# Patient Record
Sex: Male | Born: 1960 | Race: White | Hispanic: No | Marital: Married | Smoking: Never smoker
Health system: Southern US, Community
[De-identification: ages and names within clinical notes are randomized; demographics above are authoritative.]

## PROBLEM LIST (undated history)

## (undated) DIAGNOSIS — T7840XA Allergy, unspecified, initial encounter: Secondary | ICD-10-CM

## (undated) DIAGNOSIS — N189 Chronic kidney disease, unspecified: Secondary | ICD-10-CM

## (undated) DIAGNOSIS — E785 Hyperlipidemia, unspecified: Secondary | ICD-10-CM

## (undated) HISTORY — PX: FINGER SURGERY: SHX640

## (undated) HISTORY — DX: Chronic kidney disease, unspecified: N18.9

## (undated) HISTORY — PX: HERNIA REPAIR: SHX51

## (undated) HISTORY — DX: Allergy, unspecified, initial encounter: T78.40XA

## (undated) HISTORY — DX: Hyperlipidemia, unspecified: E78.5

---

## 2012-09-05 ENCOUNTER — Ambulatory Visit: Payer: Self-pay | Admitting: Sports Medicine

## 2012-09-19 ENCOUNTER — Ambulatory Visit: Payer: Self-pay | Admitting: Sports Medicine

## 2014-12-18 ENCOUNTER — Emergency Department (HOSPITAL_BASED_OUTPATIENT_CLINIC_OR_DEPARTMENT_OTHER)
Admission: EM | Admit: 2014-12-18 | Discharge: 2014-12-18 | Disposition: A | Discharge status: Home / Self Care | Payer: Managed Care, Other (non HMO) | Attending: Emergency Medicine | Admitting: Emergency Medicine

## 2014-12-18 ENCOUNTER — Encounter (HOSPITAL_BASED_OUTPATIENT_CLINIC_OR_DEPARTMENT_OTHER): Payer: Self-pay | Admitting: Emergency Medicine

## 2014-12-18 ENCOUNTER — Emergency Department (HOSPITAL_BASED_OUTPATIENT_CLINIC_OR_DEPARTMENT_OTHER): Payer: Managed Care, Other (non HMO)

## 2014-12-18 DIAGNOSIS — Y9389 Activity, other specified: Secondary | ICD-10-CM | POA: Diagnosis not present

## 2014-12-18 DIAGNOSIS — Z23 Encounter for immunization: Secondary | ICD-10-CM | POA: Insufficient documentation

## 2014-12-18 DIAGNOSIS — Y288XXA Contact with other sharp object, undetermined intent, initial encounter: Secondary | ICD-10-CM | POA: Diagnosis not present

## 2014-12-18 DIAGNOSIS — Y9289 Other specified places as the place of occurrence of the external cause: Secondary | ICD-10-CM | POA: Insufficient documentation

## 2014-12-18 DIAGNOSIS — Y998 Other external cause status: Secondary | ICD-10-CM | POA: Diagnosis not present

## 2014-12-18 DIAGNOSIS — S61219A Laceration without foreign body of unspecified finger without damage to nail, initial encounter: Secondary | ICD-10-CM

## 2014-12-18 DIAGNOSIS — S61210A Laceration without foreign body of right index finger without damage to nail, initial encounter: Secondary | ICD-10-CM | POA: Insufficient documentation

## 2014-12-18 DIAGNOSIS — S6991XA Unspecified injury of right wrist, hand and finger(s), initial encounter: Secondary | ICD-10-CM | POA: Diagnosis present

## 2014-12-18 MED ORDER — LIDOCAINE HCL (PF) 1 % IJ SOLN
10.0000 mL | Freq: Once | INTRAMUSCULAR | Status: AC
  Administered 2014-12-18: 10 mL via INTRADERMAL
  Filled 2014-12-18: qty 10

## 2014-12-18 MED ORDER — CEFAZOLIN SODIUM 1 G IJ SOLR
2.0000 g | Freq: Once | INTRAMUSCULAR | Status: AC
  Administered 2014-12-18: 2 g via INTRAMUSCULAR
  Filled 2014-12-18: qty 20

## 2014-12-18 MED ORDER — LIDOCAINE HCL (PF) 1 % IJ SOLN
30.0000 mL | Freq: Once | INTRAMUSCULAR | Status: DC
  Filled 2014-12-18: qty 30

## 2014-12-18 MED ORDER — TETANUS-DIPHTH-ACELL PERTUSSIS 5-2.5-18.5 LF-MCG/0.5 IM SUSP
0.5000 mL | Freq: Once | INTRAMUSCULAR | Status: AC
  Administered 2014-12-18: 0.5 mL via INTRAMUSCULAR
  Filled 2014-12-18: qty 0.5

## 2014-12-18 NOTE — ED Notes (Addendum)
Patient c/o laceration to 1st finger on right hand.  He states he was using a Counsellor and injured his finger on the blade.  He states he was on a ladder, trimming tree branches.  Gauze intact and bleeding controlled.

## 2014-12-18 NOTE — Discharge Instructions (Signed)

## 2014-12-19 NOTE — ED Provider Notes (Signed)
CSN: [id number]     Arrival date & time 12/18/14  1057 History   First MD Initiated Contact with Patient 12/18/14 1101     Chief Complaint  Patient presents with  . Finger Injury     (Consider location/radiation/quality/duration/timing/severity/associated sxs/prior Treatment) Patient is a 54 y.o. male presenting with skin laceration.  Laceration Location: R index finger. Length (cm):  2 Depth:  Through underlying tissue Bleeding: controlled   Time since incident:  1 hour Injury mechanism: hedge trimmer. Pain details:    Quality:  Aching   Severity:  Severe   Timing:  Constant   Progression:  Unchanged Foreign body present:  No foreign bodies Relieved by:  Nothing Worsened by:  Movement and pressure Tetanus status:  Unknown   History reviewed. No pertinent past medical history. History reviewed. No pertinent past surgical history. History reviewed. No pertinent family history. Social History  Substance Use Topics  . Smoking status: Never Smoker   . Smokeless tobacco: None  . Alcohol Use: Yes     Comment: occasionally    Review of Systems  All other systems reviewed and are negative.     Allergies  Review of patient's allergies indicates no known allergies.  Home Medications   Prior to Admission medications   Not on File   BP 119/80 mmHg  Pulse 72  Temp(Src) 98.8 F (37.1 C) (Oral)  Resp 18  Ht 6' (1.829 m)  Wt 205 lb (92.987 kg)  BMI 27.80 kg/m2  SpO2 98% Physical Exam  Constitutional: He is oriented to person, place, and time. He appears well-developed and well-nourished.  HENT:  Head: Normocephalic and atraumatic.  Eyes: Conjunctivae and EOM are normal.  Neck: Normal range of motion. Neck supple.  Cardiovascular: Normal rate, regular rhythm and normal heart sounds.   Pulmonary/Chest: Effort normal and breath sounds normal. No respiratory distress.  Abdominal: He exhibits no distension. There is no tenderness. There is no rebound and no guarding.   Musculoskeletal: Normal range of motion.  Neurological: He is alert and oriented to person, place, and time.  Skin: Skin is warm and dry.  Deep laceration through pulp of R 2nd distal phalanx ending 5mm proximal to cuticle.  NV intact distally.  Length 2cm  Vitals reviewed.   ED Course  Procedures (including critical care time) Labs Review Labs Reviewed - No data to display  Imaging Review Dg Finger Index Right  12/18/2014   CLINICAL DATA:  Injury while using hedge trimmer  EXAM: RIGHT SECOND FINGER 2+V  COMPARISON:  None.  FINDINGS: Frontal, oblique, and lateral views were obtained. There is an obliquely oriented nondisplaced fracture of the midportion of the second distal phalanx. There is associated soft tissue injury. No other fracture. No dislocation. No radiopaque foreign body. Joint spaces appear intact.  IMPRESSION: Obliquely oriented fracture midportion second distal phalanx with soft tissue injury. No radiopaque foreign body. No dislocation. No appreciable arthropathic change.   Electronically Signed   By: Bretta Bang III M.D.   On: 12/18/2014 12:17   I have personally reviewed and evaluated these images and lab results as part of my medical decision-making.   EKG Interpretation None      MDM   Final diagnoses:  Finger laceration, initial encounter    54 y.o. male without pertinent PMH presents with laceration of right index finger from a hedge trimmer. Tetanus status unknown.  TDAp updated.  XR revealed underlying fx.  Spoke with Dr. Amanda Pea of hand, who requested pt be dc  to fu immediately in clinic.  2gm ancef IM given.  DC in stable condition    I have reviewed all laboratory and imaging studies if ordered as above  1. Finger laceration, initial encounter         Mirian Mo, MD 12/19/14 0730

## 2016-09-20 IMAGING — DX DG FINGER INDEX 2+V*R*
3 series · 3 of 3 positions shown · non-contrast
Comparison: None.

CLINICAL DATA: Injury while using [employer]

EXAM:
RIGHT SECOND FINGER 2+V

[finger ap]
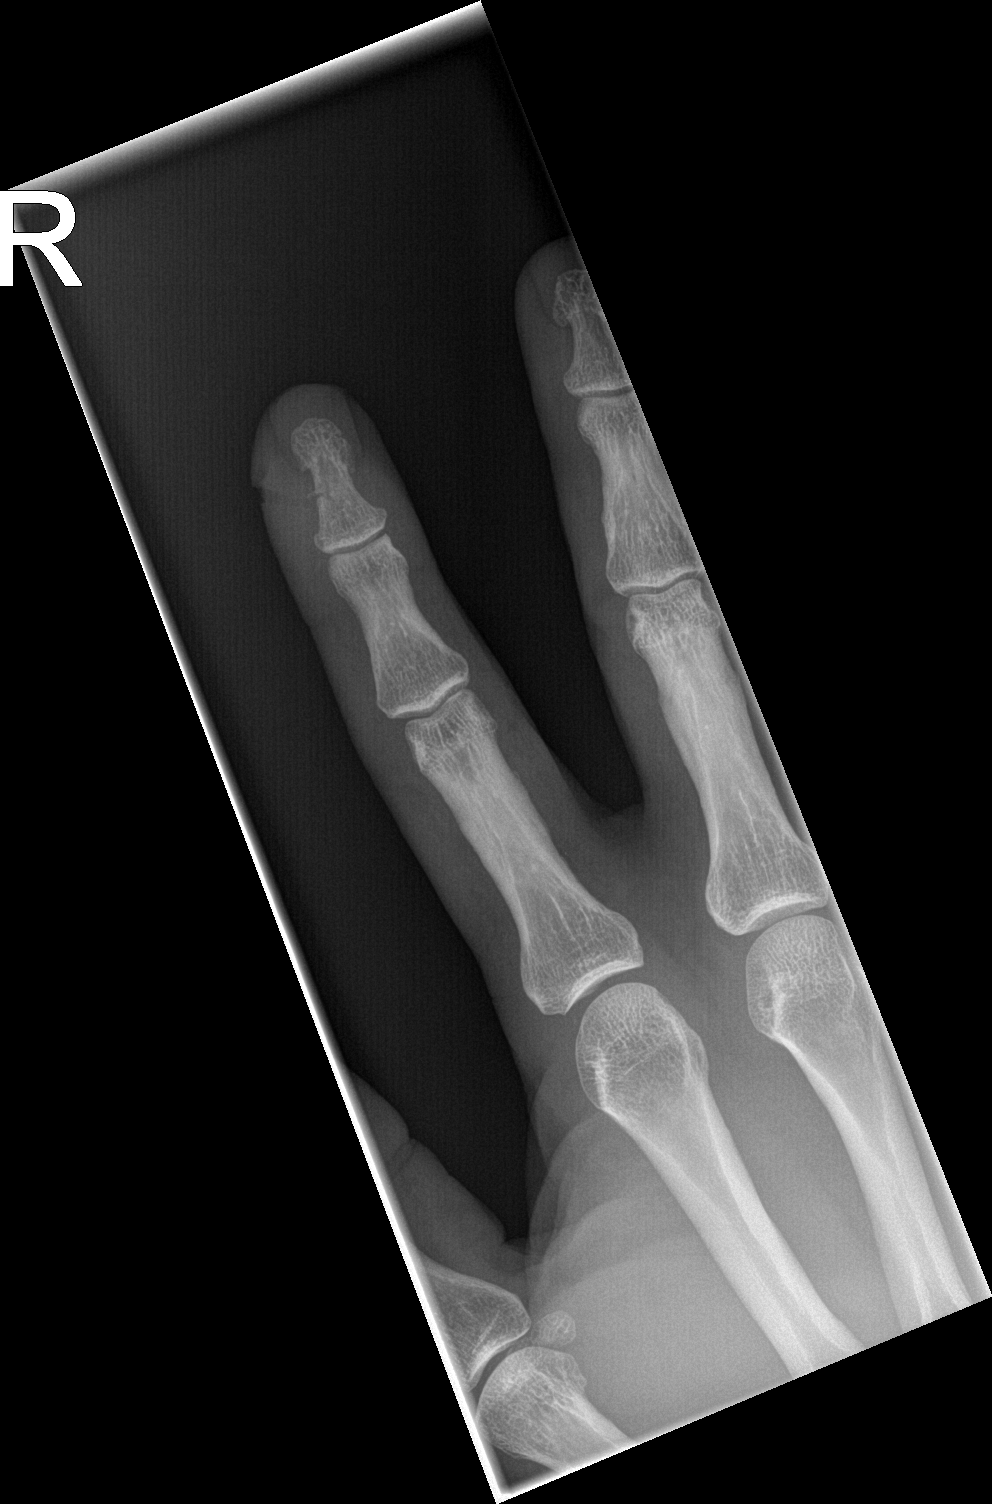

[finger obl]
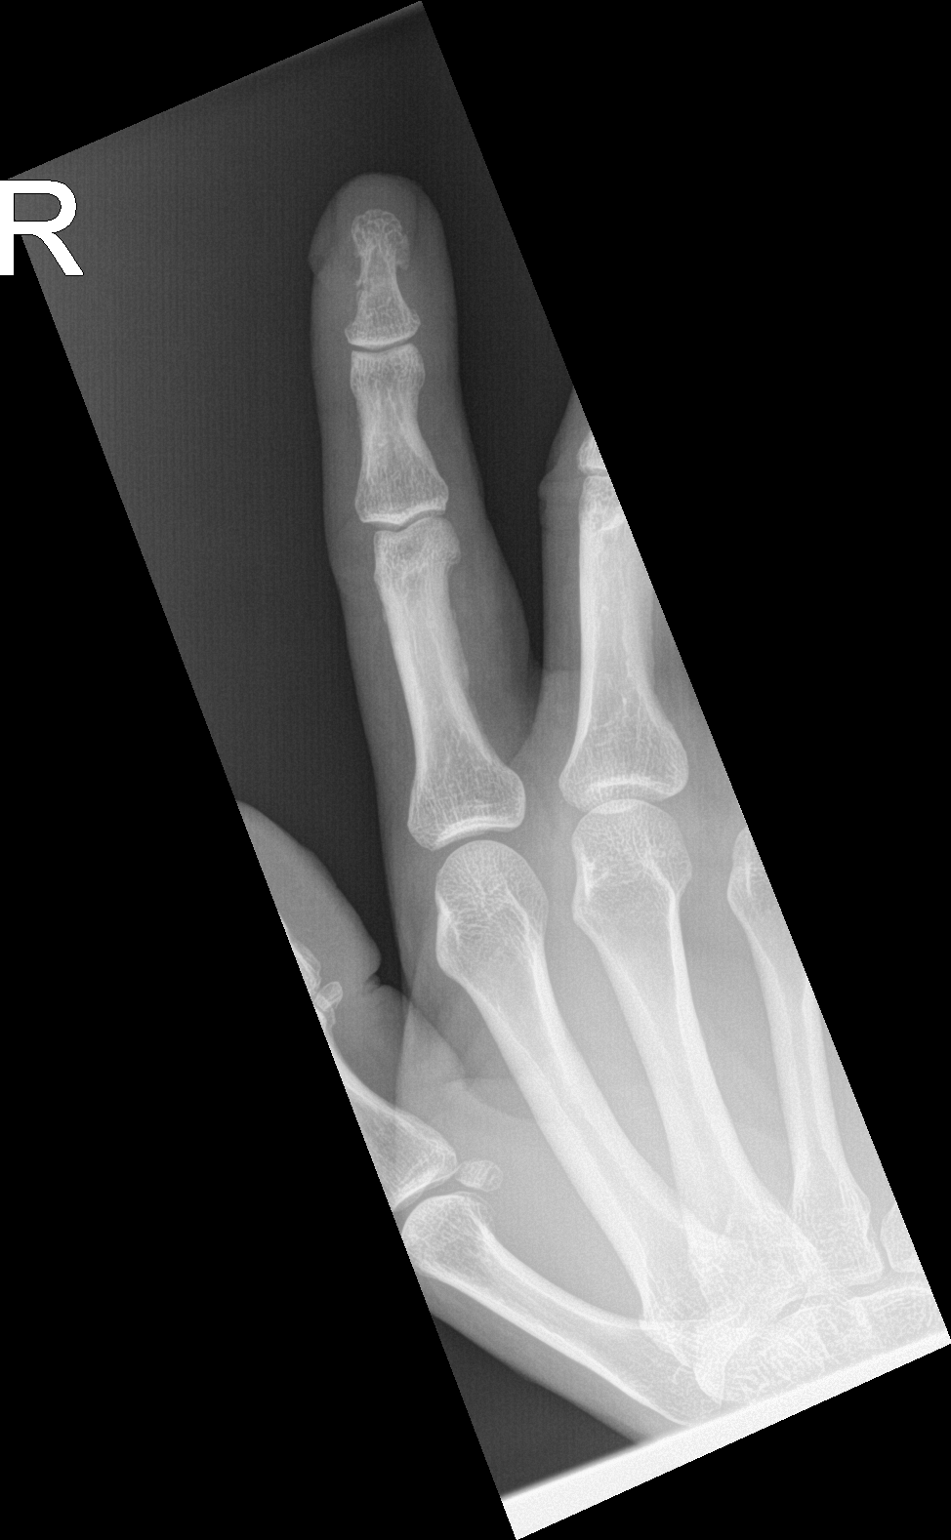

[finger lat]
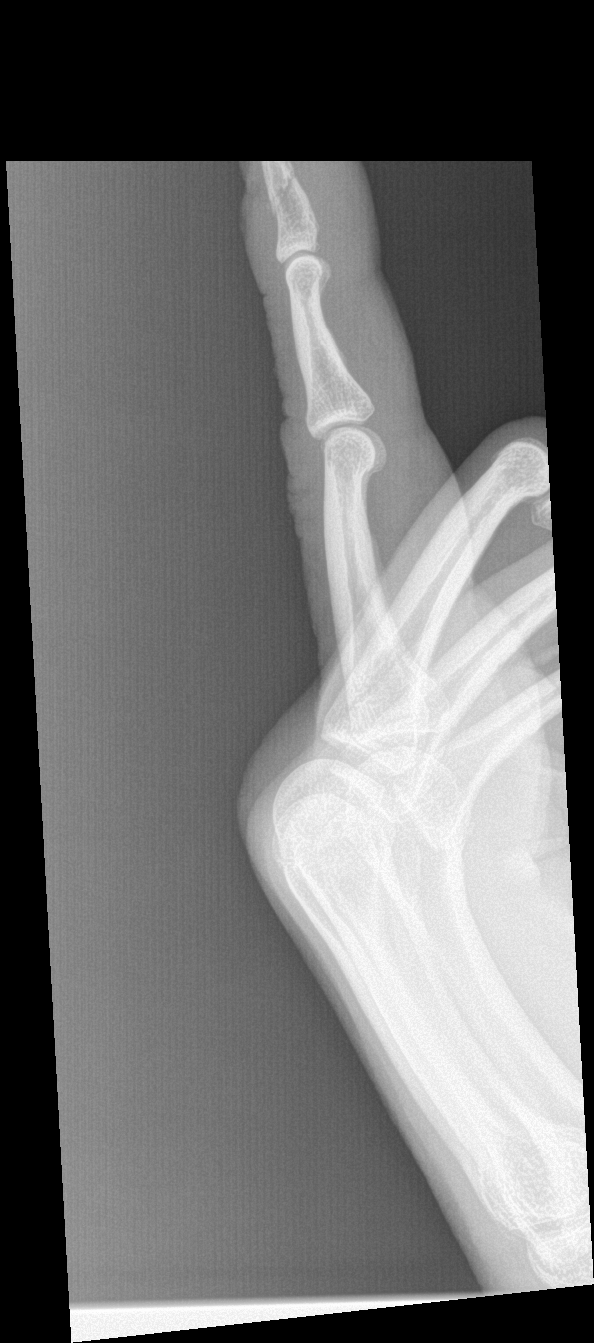

[3 of 3 positions shown; findings below may reference images not displayed]

FINDINGS: Frontal, oblique, and lateral views were obtained. There is an
obliquely oriented nondisplaced fracture of the midportion of the
second distal phalanx. There is associated soft tissue injury. No
other fracture. No dislocation. No radiopaque foreign body. Joint
spaces appear intact.
IMPRESSION: Obliquely oriented fracture midportion second distal phalanx with
soft tissue injury. No radiopaque foreign body. No dislocation. No
appreciable arthropathic change.

## 2018-10-30 LAB — LIPID PANEL
Cholesterol: 306 — AB (ref 0–200)
HDL: 43 (ref 35–70)
LDL Cholesterol: 198

## 2018-10-30 LAB — PSA: PSA: 1.03

## 2018-10-30 LAB — BASIC METABOLIC PANEL: Glucose: 89

## 2020-06-24 NOTE — Progress Notes (Signed)
Phone: [phone]   Subjective:  Patient presents today to establish care.  Prior patient of [facility].  Chief Complaint  Patient presents with  . New Patient (Initial Visit)    Establishing care    See problem oriented charting  ROS- full ROS completed and negative except for weight gain of 20 lbs over covid 19 period- last 2 years- possibly more, arthritis in fingers- also has had carpal tunnel, high cholesterol  The following were reviewed and entered/updated in epic: Past Medical History:  Diagnosis Date  . Allergy   . 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 Hyperlipidemia    Patient Active Problem List   Diagnosis Date Noted  . Hyperlipidemia 06/25/2020    Priority: Medium  . 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 Allergic rhinitis 06/25/2020    Priority: Low  . Carpal tunnel syndrome 06/25/2020    Priority: Low  . Osteoarthritis, hand 06/25/2020    Priority: Low   Past Surgical History:  Procedure Laterality Date  . FINGER SURGERY     hedge trimmer accident- right 2nd finger  . HERNIA REPAIR      Family History  Problem Relation Age of Onset  . Arthritis Mother   . Hyperlipidemia Mother   . Alcohol abuse Father   . Benign prostatic hyperplasia Father        sounds like TURP  . Diabetes Sister   . Other Sister        car accident  . Other Maternal Grandmother        lived ot 53  . Heart disease Maternal Grandfather        open heart late 69s or 60s. never smoker  . Heart attack Maternal Grandfather   . Lymphoma Maternal Grandfather   . Early death Paternal Grandmother        unclear cause and age  . Early death Paternal Grandfather        unclear cause and age    Medications- reviewed and updated Current Outpatient Medications  Medication Sig Dispense Refill  . Multiple Vitamin (MULTI-VITAMIN DAILY PO) Take by mouth.     No current facility-administered medications for this visit.    Allergies-reviewed and  updated Allergies  Allergen Reactions  . Bee Pollen Itching    Eyes itch and water.   Valentino Saxon Swelling    Tongue swelling     Social History   Social History Narrative   Married. 86 year old son at [employer] and 85 year old senior at [employer] in 06/2020.       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 Oropharynx normal. TM normal. Eyes: sclera and lids normal, PERRLA Neck: no thyromegaly, no cervical lymphadenopathy CV: RRR no murmurs rubs or gallops Lungs: CTAB no crackles, wheeze, rhonchi Abdomen: soft/nontender/nondistended/normal bowel sounds. No rebound or guarding.  Ext: no edema Skin: warm, dry Neuro: 5/5 strength in upper and lower extremities, normal gait     Assessment and Plan  60 y.o. male presenting for annual physical.  Health Maintenance counseling: 1. Anticipatory guidance: Patient  counseled regarding regular dental exams -q6 months, eye exams - yearlyyearly,  avoiding smoking and second hand smoke , limiting alcohol to 2 beverages per day - discussed cutting down to 14 max per week- also consider therapy- about at 18 a week and occasional binges- could consider AA.   2. Risk factor reduction:  Advised patient of need for regular exercise and diet rich and fruits and vegetables to reduce risk of heart attack and stroke. Exercise- once or twice a week- run/jog- also has [employer] membership- might use ellipitcal. Diet-recommended calorie counting or using half plate method- he has gained some over pandemic and wants to lose this.  Wt Readings from Last 3 Encounters:  06/25/20 219 lb 3.2 oz (99.4 kg)  12/18/14 205 lb (93 kg)  3. Immunizations/screenings/ancillary studies- consider  shingrix in future- has busy weekend planned. HCV screen and HIV screen- has donated blood to [facility] within 1 years Immunization History  Administered Date(s) Administered  . Influenza-Unspecified 01/16/2020  . PFIZER(Purple Top)SARS-COV-2 Vaccination 07/13/2019, 08/09/2019, 03/07/2020  . Tdap 12/18/2014  4. Prostate cancer screening- will trend with labs 5. Colon cancer screening - had in 2016- we will get records 6. Skin cancer screening- no dermatologist. advised regular sunscreen use. Denies worrisome, changing, or new skin lesions.  7. Never smoker 8. STD screening - only active with wife  Status of chronic or acute concerns   #hyperlipidemia S: Medication: none  No results found for: CHOL, HDL, LDLCALC, LDLDIRECT, TRIG, CHOLHDL A/P: Has had prior total cholesterol over 300 and LDL over 190 (has gotten #s down with weight loss in past)-he has preferred to remain off medication and try to work on lifestyle.  We will update levels today and reevaluate -prefers to work on lifestyle still (overweight status noted) -interested in coronary calcium- knows has $99 test.   Recommended follow up: Return in about 1 year (around 06/25/2021) for physical or sooner if needed.  Lab/Order associations: will come back fasting   ICD-10-CM   1. Seasonal allergic rhinitis due to pollen  J30.1   2. Preventative health care  Z00.00 CBC with Differential/Platelet    Comprehensive metabolic panel    Lipid panel    PSA    POCT Urinalysis Dipstick (Automated)  3. Hyperlipidemia, unspecified hyperlipidemia type  E78.5 CBC with Differential/Platelet    Comprehensive metabolic panel    Lipid panel    POCT Urinalysis Dipstick (Automated)  4. Horseshoe kidney  Q63.1 POCT Urinalysis Dipstick (Automated)  5. Bilateral carpal tunnel syndrome  G56.03   6. Primary osteoarthritis of left hand  M19.042   7. Screening for prostate cancer  Z12.5 PSA    No orders of the defined types were placed in this  encounter.   Return precautions advised.  Tana Conch, MD

## 2020-06-24 NOTE — Patient Instructions (Addendum)
  Please stop by lab before you go If you have mychart- we will send your results within 3 business days of [place] receiving them.  If you do not have mychart- we will call you about results within 5 business days of [place] receiving them.  *please also note that you will see labs on mychart as soon as they post. I will later go in and write notes on them- will say "notes from Dr. Durene Cal"  Please call [phone] to schedule a visit with [facility]- I think this is reasonable to consider with alcohol intake. Could also consider AA  Health Maintenance Due  Topic Date Due  . COLONOSCOPY- Sign release of information at the check out desk for last colonoscopy from [facility] Never done   Schedule a lab visit at the check out desk within 2 weeks. Return for future fasting labs meaning nothing but water after midnight please. Ok to take your medications with water.   -you can also try to schedule a nurse visit for Shingrix # Repeat injection in 2-5 months after first injection. Schedule a nurse visit for the 2nd injection before you leave today as well (at the check out desk)  Recommended follow up: Return in about 1 year (around 06/25/2021) for physical or sooner if needed.

## 2020-06-25 ENCOUNTER — Ambulatory Visit (INDEPENDENT_AMBULATORY_CARE_PROVIDER_SITE_OTHER): Payer: 59 | Admitting: Family Medicine

## 2020-06-25 ENCOUNTER — Other Ambulatory Visit: Payer: Self-pay

## 2020-06-25 ENCOUNTER — Encounter: Payer: Self-pay | Admitting: Family Medicine

## 2020-06-25 VITALS — BP 118/78 | HR 67 | Temp 97.8°F | Ht 72.0 in | Wt 219.2 lb

## 2020-06-25 DIAGNOSIS — E785 Hyperlipidemia, unspecified: Secondary | ICD-10-CM | POA: Diagnosis not present

## 2020-06-25 DIAGNOSIS — M19049 Primary osteoarthritis, unspecified hand: Secondary | ICD-10-CM | POA: Insufficient documentation

## 2020-06-25 DIAGNOSIS — Q631 Lobulated, fused and horseshoe kidney: Secondary | ICD-10-CM | POA: Diagnosis not present

## 2020-06-25 DIAGNOSIS — J301 Allergic rhinitis due to pollen: Secondary | ICD-10-CM | POA: Diagnosis not present

## 2020-06-25 DIAGNOSIS — Z0000 Encounter for general adult medical examination without abnormal findings: Secondary | ICD-10-CM | POA: Diagnosis not present

## 2020-06-25 DIAGNOSIS — Z1159 Encounter for screening for other viral diseases: Secondary | ICD-10-CM

## 2020-06-25 DIAGNOSIS — Z114 Encounter for screening for human immunodeficiency virus [HIV]: Secondary | ICD-10-CM

## 2020-06-25 DIAGNOSIS — G5603 Carpal tunnel syndrome, bilateral upper limbs: Secondary | ICD-10-CM

## 2020-06-25 DIAGNOSIS — M19042 Primary osteoarthritis, left hand: Secondary | ICD-10-CM

## 2020-06-25 DIAGNOSIS — G5600 Carpal tunnel syndrome, unspecified upper limb: Secondary | ICD-10-CM | POA: Insufficient documentation

## 2020-06-25 DIAGNOSIS — J309 Allergic rhinitis, unspecified: Secondary | ICD-10-CM | POA: Insufficient documentation

## 2020-06-25 DIAGNOSIS — Z125 Encounter for screening for malignant neoplasm of prostate: Secondary | ICD-10-CM

## 2020-06-25 DIAGNOSIS — Z1211 Encounter for screening for malignant neoplasm of colon: Secondary | ICD-10-CM

## 2020-06-25 NOTE — Addendum Note (Signed)
Addended by: Shelva Majestic on: 06/25/2020 05:13 PM   Modules accepted: Level of Service

## 2020-06-29 ENCOUNTER — Encounter: Payer: Self-pay | Admitting: Family Medicine

## 2020-07-14 ENCOUNTER — Other Ambulatory Visit: Payer: 59

## 2020-07-14 ENCOUNTER — Ambulatory Visit: Payer: 59

## 2020-07-21 ENCOUNTER — Other Ambulatory Visit: Payer: 59

## 2020-07-21 ENCOUNTER — Ambulatory Visit: Payer: 59

## 2020-08-11 ENCOUNTER — Other Ambulatory Visit: Payer: 59

## 2020-08-11 ENCOUNTER — Ambulatory Visit: Payer: 59

## 2020-08-19 ENCOUNTER — Ambulatory Visit (INDEPENDENT_AMBULATORY_CARE_PROVIDER_SITE_OTHER): Payer: 59

## 2020-08-19 ENCOUNTER — Other Ambulatory Visit: Payer: Self-pay

## 2020-08-19 ENCOUNTER — Other Ambulatory Visit (INDEPENDENT_AMBULATORY_CARE_PROVIDER_SITE_OTHER): Payer: 59

## 2020-08-19 DIAGNOSIS — Z23 Encounter for immunization: Secondary | ICD-10-CM | POA: Diagnosis not present

## 2020-08-19 DIAGNOSIS — Z125 Encounter for screening for malignant neoplasm of prostate: Secondary | ICD-10-CM

## 2020-08-19 DIAGNOSIS — E785 Hyperlipidemia, unspecified: Secondary | ICD-10-CM | POA: Diagnosis not present

## 2020-08-19 DIAGNOSIS — Z0000 Encounter for general adult medical examination without abnormal findings: Secondary | ICD-10-CM | POA: Diagnosis not present

## 2020-08-19 LAB — COMPREHENSIVE METABOLIC PANEL
ALT: 30 U/L (ref 0–53)
AST: 29 U/L (ref 0–37)
Albumin: 4.4 g/dL (ref 3.5–5.2)
Alkaline Phosphatase: 67 U/L (ref 39–117)
BUN: 18 mg/dL (ref 6–23)
CO2: 25 mEq/L (ref 19–32)
Calcium: 9.2 mg/dL (ref 8.4–10.5)
Chloride: 99 mEq/L (ref 96–112)
Creatinine, Ser: 0.92 mg/dL (ref 0.40–1.50)
GFR: 92.41 mL/min (ref >60.00)
Glucose, Bld: 90 mg/dL (ref 70–99)
Potassium: 4.5 mEq/L (ref 3.5–5.1)
Sodium: 136 mEq/L (ref 135–145)
Total Bilirubin: 0.6 mg/dL (ref 0.2–1.2)
Total Protein: 7.8 g/dL (ref 6.0–8.3)

## 2020-08-19 LAB — LIPID PANEL
Cholesterol: 309 mg/dL — ABNORMAL HIGH (ref 0–200)
HDL: 44.8 mg/dL (ref >39.00)
Total CHOL/HDL Ratio: 7
Triglycerides: 498 mg/dL — ABNORMAL HIGH (ref 0.0–149.0)

## 2020-08-19 LAB — CBC WITH DIFFERENTIAL/PLATELET
Basophils Absolute: 0 10*3/uL (ref 0.0–0.1)
Basophils Relative: 0.8 % (ref 0.0–3.0)
Eosinophils Absolute: 0.1 10*3/uL (ref 0.0–0.7)
Eosinophils Relative: 2.1 % (ref 0.0–5.0)
HCT: 40.9 % (ref 39.0–52.0)
Hemoglobin: 14.1 g/dL (ref 13.0–17.0)
Lymphocytes Relative: 30.9 % (ref 12.0–46.0)
Lymphs Abs: 1.9 10*3/uL (ref 0.7–4.0)
MCHC: 34.5 g/dL (ref 30.0–36.0)
MCV: 93.5 fl (ref 78.0–100.0)
Monocytes Absolute: 0.7 10*3/uL (ref 0.1–1.0)
Monocytes Relative: 11.4 % (ref 3.0–12.0)
Neutro Abs: 3.4 10*3/uL (ref 1.4–7.7)
Neutrophils Relative %: 54.8 % (ref 43.0–77.0)
Platelets: 272 10*3/uL (ref 150.0–400.0)
RBC: 4.37 Mil/uL (ref 4.22–5.81)
RDW: 13.1 % (ref 11.5–15.5)
WBC: 6.1 10*3/uL (ref 4.0–10.5)

## 2020-08-19 LAB — LDL CHOLESTEROL, DIRECT: Direct LDL: 165 mg/dL

## 2020-08-19 LAB — PSA: PSA: 1.03 ng/mL (ref 0.10–4.00)

## 2020-08-23 ENCOUNTER — Other Ambulatory Visit: Payer: Self-pay | Admitting: Family Medicine

## 2020-08-23 DIAGNOSIS — E785 Hyperlipidemia, unspecified: Secondary | ICD-10-CM

## 2020-08-23 NOTE — Progress Notes (Unsigned)
The 10-year ASCVD risk score Denman George DC Montez Hageman., et al., 2013) is: 10%   Values used to calculate the score:     Age: 60 years     Sex: Male     Is Non-Hispanic African American: No     Diabetic: No     Tobacco smoker: No     Systolic Blood Pressure: 118 mmHg     Is BP treated: No     HDL Cholesterol: 44.8 mg/dL     Total Cholesterol: 309 mg/dL

## 2020-11-09 ENCOUNTER — Ambulatory Visit (INDEPENDENT_AMBULATORY_CARE_PROVIDER_SITE_OTHER): Payer: 59

## 2020-11-09 ENCOUNTER — Other Ambulatory Visit: Payer: Self-pay

## 2020-11-09 DIAGNOSIS — Z23 Encounter for immunization: Secondary | ICD-10-CM

## 2021-06-17 NOTE — Progress Notes (Signed)
? ?Phone: [phone] ?  ?Subjective:  ?Patient presents today for their annual physical. Chief complaint-noted.  ? ?See problem oriented charting- ?ROS- full  review of systems was completed and negative  ?except for: chest pain, dizziness ? ?The following were reviewed and entered/updated in epic: ?Past Medical History:  ?Diagnosis Date  ? Allergy   ? Chronic kidney disease   ? 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 Hyperlipidemia   ? ?Patient Active Problem List  ? Diagnosis Date Noted  ? Exertional chest pain 06/27/2021  ?  Priority: High  ? Hyperlipidemia 06/25/2020  ?  Priority: Medium   ? 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  Priority: Medium   ? Allergic rhinitis 06/25/2020  ?  Priority: Low  ? Carpal tunnel syndrome 06/25/2020  ?  Priority: Low  ? 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  Priority: Low  ? ?Past Surgical History:  ?Procedure Laterality Date  ? FINGER SURGERY    ? hedge trimmer accident- right 2nd finger  ? HERNIA REPAIR    ? ? ?Family History  ?Problem Relation Age of Onset  ? Arthritis Mother   ? Hyperlipidemia Mother   ? Alcohol abuse Father   ? Benign prostatic hyperplasia Father   ?     sounds like TURP  ? Diabetes Sister   ? Other Sister   ?     car accident  ? Other Maternal Grandmother   ?     lived ot 38  ? Heart disease Maternal Grandfather   ?     open heart late 61s or 60s. never smoker  ? Heart attack Maternal Grandfather   ? Lymphoma Maternal Grandfather   ? Early death Paternal Grandmother   ?     unclear cause and age  ? Early death Paternal Grandfather   ?     unclear cause and age  ? ?Medications- reviewed and updated ?Current Outpatient Medications  ?Medication Sig Dispense Refill  ? aspirin EC 81 MG tablet Take 81 mg by mouth daily. Swallow whole.    ? Multiple Vitamin (MULTI-VITAMIN DAILY PO) Take by mouth.    ? ?No current facility-administered medications for this visit.  ? ?Allergies-reviewed and updated ?Allergies  ?Allergen Reactions  ? Bee Pollen Itching  ?  Eyes itch and water.   ? Cherry  Swelling  ?  Tongue swelling   ? ? ?Social History  ? ?Social History Narrative  ? Married. 27 year old son at [employer] senior and 68 year old freshman at [employer] 06/2021  ?   ? 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   ? Hobbies: shows, music, movies, golf, tennis, yardwork  ? ?Objective  ?Objective:  ?BP 102/60   Pulse (!) 56   Temp 97.7 ?F (36.5 ?C)   Ht 6' (1.829 m)   Wt 221 lb (100.2 kg)   SpO2 98%   BMI 29.97 kg/m?  ?Gen: NAD, resting comfortably ?HEENT: Mucous membranes are moist. Oropharynx normal. TM normal ?Neck: no thyromegaly ?CV: RRR no murmurs rubs or gallops ?Lungs: CTAB no crackles, wheeze, rhonchi ?Abdomen: soft/nontender/nondistended/normal bowel sounds. No rebound or guarding.  ?Ext: no edema ?Skin: warm, dry ?Neuro: grossly normal, moves all extremities, PERRLA ? ?  ?Assessment and Plan  ?61 y.o. male presenting for annual physical.  ?Health Maintenance counseling: ?1. Anticipatory guidance: Patient counseled regarding regular dental exams -q6 months, eye exams -yearly,  avoiding smoking and second hand smoke , limiting alcohol to 2 beverages per day - has cut  down on alcohol- down to 4 a week from 18- should help triglycerides. No illicit drugs.  ?2. Risk factor reduction:  Advised patient of need for regular exercise and diet rich and fruits and vegetables to reduce risk of heart attack and stroke.  ?Exercise- two to 3 days a week on elliptical for 30 minutes after Crhistmas ?Diet/weight management- weight within 2 lbs of last year- was heavier coming out of holidays and has trended down ?Wt Readings from Last 3 Encounters:  ?06/27/21 221 lb (100.2 kg)  ?06/25/20 219 lb 3.2 oz (99.4 kg)  ?12/18/14 205 lb (93 kg)  ?3. Immunizations/screenings/ancillary studies-fully up-to-date  ?Immunization History  ?Administered Date(s) Administered  ? Influenza,inj,Quad PF,6+ Mos 03/31/2021  ? Influenza-Unspecified 01/16/2020  ? PFIZER(Purple Top)SARS-COV-2 Vaccination 07/13/2019, 08/09/2019, 03/07/2020   ? Research officer, trade union 43yrs & up 03/31/2021  ? Tdap 12/18/2014  ? Zoster Recombinat (Shingrix) 08/19/2020, 11/09/2020  ?4. Prostate cancer screening- will trend with labs   ?Lab Results  ?Component Value Date  ? PSA 1.03 08/19/2020  ? PSA 1.03 10/30/2018  ? 5. Colon cancer screening -  had in 2016- we will get records. States referre dby [facility] manager but didn't see Deboraha Sprang- will look fro records ?6. Skin cancer screening-  no dermatologist. advised regular sunscreen use. Denies worrisome, changing, or new skin lesions.  ?7. Smoking associated screening (lung cancer screening, AAA screen 65-75, UA)- never smoker ?8. STD screening - only active with wife ? ?Status of chronic or acute concerns  ? ?#Chest pain and hyperlipidemia ?See problem oriented charting/separate note ? ?Recommended follow up: Return in about 1 year (around 06/28/2022) for physical or sooner if needed. Such as related to cchest pain ? ?Lab/Order associations: NOT fasting ?  ICD-10-CM   ?1. Preventative health care  Z00.00   ?  ?2. Chest tightness  R07.89 EKG 12-Lead  ?  Ambulatory referral to Cardiology  ?  ?3. Hyperlipidemia, unspecified hyperlipidemia type  E78.5 CBC with Differential/Platelet  ?  Comprehensive metabolic panel  ?  Lipid panel  ?  ?4. Encounter for general adult medical examination with abnormal findings  Z00.01   ?  ?5. Screening for prostate cancer  Z12.5 PSA  ?  ?6. Exertional chest pain  R07.9 Ambulatory referral to Cardiology  ?  ?7. Screening exam for skin cancer  Z12.83 Ambulatory referral to Dermatology  ?  ? ?Meds ordered this encounter  ?Medications  ? rosuvastatin (CRESTOR) 20 MG tablet  ?  Sig: Take 1 tablet (20 mg total) by mouth daily.  ?  Dispense:  90 tablet  ?  Refill:  3  ? nitroGLYCERIN (NITROSTAT) 0.4 MG SL tablet  ?  Sig: Place 1 tablet (0.4 mg total) under the tongue every 5 (five) minutes as needed for chest pain (max twice then call 911).  ?  Dispense:  50 tablet  ?  Refill:  3  ? ?I,Jada  Bradford,acting as a scribe for Tana Conch, MD.,have documented all relevant documentation on the behalf of Tana Conch, MD,as directed by  Tana Conch, MD while in the presence of Tana Conch, MD. ? ?I, Tana Conch, MD, have reviewed all documentation for this visit. The documentation on 06/27/21 for the exam, diagnosis, procedures, and orders are all accurate and complete. ? ?Return precautions advised.  ?Tana Conch, MD ? ? ? ?

## 2021-06-27 ENCOUNTER — Encounter: Payer: Self-pay | Admitting: Family Medicine

## 2021-06-27 ENCOUNTER — Ambulatory Visit (INDEPENDENT_AMBULATORY_CARE_PROVIDER_SITE_OTHER): Payer: 59 | Admitting: Family Medicine

## 2021-06-27 VITALS — BP 102/60 | HR 56 | Temp 97.7°F | Ht 72.0 in | Wt 221.0 lb

## 2021-06-27 DIAGNOSIS — E785 Hyperlipidemia, unspecified: Secondary | ICD-10-CM | POA: Diagnosis not present

## 2021-06-27 DIAGNOSIS — Z0001 Encounter for general adult medical examination with abnormal findings: Secondary | ICD-10-CM | POA: Diagnosis not present

## 2021-06-27 DIAGNOSIS — R079 Chest pain, unspecified: Secondary | ICD-10-CM | POA: Diagnosis not present

## 2021-06-27 DIAGNOSIS — Z1283 Encounter for screening for malignant neoplasm of skin: Secondary | ICD-10-CM

## 2021-06-27 DIAGNOSIS — Z0000 Encounter for general adult medical examination without abnormal findings: Secondary | ICD-10-CM

## 2021-06-27 DIAGNOSIS — Z125 Encounter for screening for malignant neoplasm of prostate: Secondary | ICD-10-CM | POA: Diagnosis not present

## 2021-06-27 DIAGNOSIS — R0789 Other chest pain: Secondary | ICD-10-CM | POA: Diagnosis not present

## 2021-06-27 MED ORDER — ROSUVASTATIN CALCIUM 20 MG PO TABS: 20.0000 mg | ORAL_TABLET | Freq: Every day | ORAL | 3 refills | Status: DC

## 2021-06-27 MED ORDER — NITROGLYCERIN 0.4 MG SL SUBL: 0.4000 mg | SUBLINGUAL_TABLET | SUBLINGUAL | 3 refills | Status: AC | PRN

## 2021-06-27 NOTE — Assessment & Plan Note (Signed)
S: Medication: none  ?Lab Results  ?Component Value Date  ? CHOL 309 (H) 08/19/2020  ? HDL 44.80 08/19/2020  ? LDLCALC 198 10/30/2018  ? LDLDIRECT 165.0 08/19/2020  ? TRIG (H) 08/19/2020  ?  498.0 Triglyceride is over 400; calculations on Lipids are invalid.  ? CHOLHDL 7 08/19/2020  ? A/P: last year hard wanted to work on diet/exercise and get ct cardiac scoring- there was some miscommunication on our end - I was not alerted by team he wanted to more forward- regardless with new chest pain that can be exertional we opted for urgent  referral to cardiology, start statin- rosuvastatin 20 mg daily and refer to cardiology ?

## 2021-06-27 NOTE — Progress Notes (Signed)
?Phone [phone] ?In person visit ?  ?Subjective:  ? ?Bernard Gregory is a 61 y.o. year old very pleasant male patient who presents for/with See problem oriented charting ? ?This visit occurred during the SARS-CoV-2 public health emergency.  Safety protocols were in place, including screening questions prior to the visit, additional usage of staff PPE, and extensive cleaning of exam room while observing appropriate contact time as indicated for disinfecting solutions.  ? ?Past Medical History-  ?Patient Active Problem List  ? Diagnosis Date Noted  ? Exertional chest pain 06/27/2021  ?  Priority: High  ? Hyperlipidemia 06/25/2020  ?  Priority: Medium   ? 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  Priority: Medium   ? Allergic rhinitis 06/25/2020  ?  Priority: Low  ? Carpal tunnel syndrome 06/25/2020  ?  Priority: Low  ? 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  Priority: Low  ? ? ?Medications- reviewed and updated ?Current Outpatient Medications  ?Medication Sig Dispense Refill  ? nitroGLYCERIN (NITROSTAT) 0.4 MG SL tablet Place 1 tablet (0.4 mg total) under the tongue every 5 (five) minutes as needed for chest pain (max twice then call 911). 50 tablet 3  ? rosuvastatin (CRESTOR) 20 MG tablet Take 1 tablet (20 mg total) by mouth daily. 90 tablet 3  ? Multiple Vitamin (MULTI-VITAMIN DAILY PO) Take by mouth.    ? ?No current facility-administered medications for this visit.  ? ?  ?Objective:  ?BP 102/60   Pulse (!) 56   Temp 97.7 ?F (36.5 ?C)   Ht 6' (1.829 m)   Wt 221 lb (100.2 kg)   SpO2 98%   BMI 29.97 kg/m?  ?Gen: NAD, resting comfortably ?CV: RRR no murmurs rubs or gallops ?Lungs: CTAB no crackles, wheeze, rhonchi ?Abdomen: soft/nontender/nondistended/normal bowel sounds. No rebound or guarding.  ?Ext: no edema ?Skin: warm, dry ?Neuro: grossly normal, moves all extremities ? ?EKG: sinus bradycardia with first degree av block with rate 54, normal axis, normal intervals other than prolonged PR, no hypertrophy, appears to  have some elevation in v1 and v2- ? J point elevation - inverted t wave in v1 compared to prior ?  ? ?Assessment and Plan  ? ?Exertional chest pain ?S:symptoms started 4-5 weeks ago. Central to left chest- most of the time a tightnes sin the left chest- can move to the right chest. 3 weeks ago was occurring quite a bit and was getting dizzy- was considering urgent care or calling in. Then started to improve. No active pain- last episode of pain Friday. Exercises a tleast 2 days a week on exercise- starts out no pain then gets tightness which he rates up to 6/10- gets better with continuing to exercise. Resolves usually within 10 minutes to an hour ? ?More common during (but can resolve while exercising) or after a lot of exercise - such as did a lot of yardwork several days ago and by evening  ? ?No shortness of breath with these events. No left arm or shoulder or neck pain. Some tightness in fingers but on bth sides. No connection with food. Possible coordination with stress.  ? ?Did start taking aspirin 81 mg ?A/P: 61 year old male with history untreated hyperlipidemia (had wanted to work on lifestyle) and family history of heart disease in maternal grandfather in 85s and early death in [place] and PGF unclear cause presenting with often exertional pain (or post exertion) with ekg changes in v1 and v2 ?-urgent referral to cardiology ?-start rosuvastatin 20 mg daily ?-continue aspirin  81 mg ?-if worsening symptoms call 911  ?- hold on exercise until sees cardiology ?- nitroglycerin to have on hand- with blood pressure running lower would want fmily around to make sure he tolerates ? ?Hyperlipidemia ?S: Medication: none  ?Lab Results  ?Component Value Date  ? CHOL 309 (H) 08/19/2020  ? HDL 44.80 08/19/2020  ? [place] 198 10/30/2018  ? LDLDIRECT 165.0 08/19/2020  ? TRIG (H) 08/19/2020  ?  498.0 Triglyceride is over 400; calculations on Lipids are invalid.  ? CHOLHDL 7 08/19/2020  ? A/P: last year hard wanted to work on  diet/exercise and get ct cardiac scoring- there was some miscommunication on our end - I was not alerted by team he wanted to more forward- regardless with new chest pain that can be exertional we opted for urgent  referral to cardiology, start statin- rosuvastatin 20 mg daily and refer to cardiology urgently ? ?Recommended follow up: Return in about 1 year (around 06/28/2022) for physical or sooner if needed. ?No future appointments. ? ?Lab/Order associations: ?  ICD-10-CM   ?1. Exertional chest pain  R07.9 Ambulatory referral to Cardiology  ?  ?2. Chest tightness  R07.89 EKG 12-Lead  ?  Ambulatory referral to Cardiology  ?  ?3. Hyperlipidemia, unspecified hyperlipidemia type  E78.5 CBC with Differential/Platelet  ?  Comprehensive metabolic panel  ?  Lipid panel  ?  ? ? ?Meds ordered this encounter  ?Medications  ? rosuvastatin (CRESTOR) 20 MG tablet  ?  Sig: Take 1 tablet (20 mg total) by mouth daily.  ?  Dispense:  90 tablet  ?  Refill:  3  ? nitroGLYCERIN (NITROSTAT) 0.4 MG SL tablet  ?  Sig: Place 1 tablet (0.4 mg total) under the tongue every 5 (five) minutes as needed for chest pain (max twice then call 911).  ?  Dispense:  50 tablet  ?  Refill:  3  ? ? ?Return precautions advised.  ?Garret Reddish, MD ? ? ?

## 2021-06-27 NOTE — Patient Instructions (Addendum)
We need to get a copy of colonoscopy- please try to find who did this then come back to sign a release form so we can get this or call their office and see if they are willing to fax to [place]  ?-fax to our office [phone] ? ?Schedule a lab visit at the check out desk within 2 weeks- ideally tomorrow if possible. Return for future fasting labs meaning nothing but water after midnight please. Ok to take your medications with water.  ? ?Urgent referral to cardiology- team will give you info before you leave ? ?We will call you within two weeks about your referral to dermatology. If you do not hear within 2 weeks, give [place] a call.   ? ?Exertional chest pain ?-urgent referral to cardiology ?-start rosuvastatin 20 mg daily ?-continue aspirin 81 mg ?-if worsening symptoms call 911  ?- hold on exercise until sees cardiology ?- nitroglycerin to have on hand- with blood pressure running lower would want family around to make sure he tolerates ? ? ? ?

## 2021-06-27 NOTE — Assessment & Plan Note (Addendum)
S:symptoms started 4-5 weeks ago. Central to left chest- most of the time a tightnes sin the left chest- can move to the right chest. 3 weeks ago was occurring quite a bit and was getting dizzy- was considering urgent care or calling in. Then started to improve. No active pain- last episode of pain Friday. Exercises a tleast 2 days a week on exercise- starts out no pain then gets tightness which he rates up to 6/10- gets better with continuing to exercise. Resolves usually within 10 minutes to an hour ? ?More common during (but can resolve while exercising) or after a lot of exercise - such as did a lot of yardwork several days ago and by evening  ? ?No shortness of breath with these events. No left arm or shoulder or neck pain. Some tightness in fingers but on bth sides. No connection with food. Possible coordination with stress.  ? ?Did start taking aspirin 81 mg ?A/P: 61 year old male with history untreated hyperlipidemia (had wanted to work on lifestyle) and family history of heart disease in maternal grandfather in 61s and early death in [place] and PGF unclear cause presenting with often exertional pain (or post exertion) with ekg changes in v1 and v2 ?-urgent referral to cardiology ?-start rosuvastatin 20 mg daily ?-continue aspirin 81 mg ?-if worsening symptoms call 911  ?- hold on exercise until sees cardiology ?- nitroglycerin to have on hand- with blood pressure running lower would want fmily around to make sure he tolerates ?

## 2021-06-28 ENCOUNTER — Encounter: Payer: Self-pay | Admitting: Cardiology

## 2021-06-28 ENCOUNTER — Other Ambulatory Visit: Payer: Self-pay

## 2021-06-28 ENCOUNTER — Ambulatory Visit (INDEPENDENT_AMBULATORY_CARE_PROVIDER_SITE_OTHER): Payer: 59 | Admitting: Cardiology

## 2021-06-28 VITALS — BP 120/82 | HR 55 | Ht 72.0 in | Wt 220.0 lb

## 2021-06-28 DIAGNOSIS — R079 Chest pain, unspecified: Secondary | ICD-10-CM

## 2021-06-28 DIAGNOSIS — E782 Mixed hyperlipidemia: Secondary | ICD-10-CM

## 2021-06-28 DIAGNOSIS — R072 Precordial pain: Secondary | ICD-10-CM

## 2021-06-28 MED ORDER — METOPROLOL TARTRATE 25 MG PO TABS: ORAL_TABLET | ORAL | 0 refills | Status: DC

## 2021-06-28 NOTE — Progress Notes (Signed)
?  ?Cardiology Office Note ? ? ?Date:  06/28/2021  ? ?ID:  Bernard Gregory, DOB 05-02-1960, MRN [id number] ? ?PCP:  Shelva Majestic, MD  ?Cardiologist:   Sareen Randon [place], MD  ? ?Chief Complaint  ?Patient presents with  ? Chest Pain  ? ? ?  ?History of Present Illness: ?Bernard Gregory is a 61 y.o. male who is seen at the request of Dr Durene Cal for evaluation of chest pain. He has a history of congenital horse shoe kidney and HLD. He reports 3 weeks ago he developed symptoms of chest pain anteriorly. This was at rest. Associated with lightheadedness but no SOB or diaphoresis. This lasted 1-2 hours. Since then he notes symptoms are less prevalent but have recurred. Once while working in his yard, once exercising and once playing pickleball. Symptoms improved with rest on those occasions. Once at work he got so lightheaded he felt he might pass out. Patient does stay active- uses elliptical at gym and plays tennis. He does report chest pain 6 years ago while running a 5K but states he was out of shape then. No prior cardiac evaluation.  ? ? ? ?Past Medical History:  ?Diagnosis Date  ? Allergy   ? Chronic kidney disease   ? 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 Hyperlipidemia   ? ? ?Past Surgical History:  ?Procedure Laterality Date  ? FINGER SURGERY    ? hedge trimmer accident- right 2nd finger  ? HERNIA REPAIR    ? ? ? ?Current Outpatient Medications  ?Medication Sig Dispense Refill  ? aspirin EC 81 MG tablet Take 81 mg by mouth daily. Swallow whole.    ? Multiple Vitamin (MULTI-VITAMIN DAILY PO) Take by mouth.    ? nitroGLYCERIN (NITROSTAT) 0.4 MG SL tablet Place 1 tablet (0.4 mg total) under the tongue every 5 (five) minutes as needed for chest pain (max twice then call 911). 50 tablet 3  ? rosuvastatin (CRESTOR) 20 MG tablet Take 1 tablet (20 mg total) by mouth daily. 90 tablet 3  ? ?No current facility-administered medications for this visit.  ? ? ?Allergies:   Bee pollen and Cherry  ? ? ?Social History:  The patient  reports that he  has never smoked. He has never used smokeless tobacco. He reports current alcohol use of about 18.0 standard drinks per week. He reports that he does not use drugs.  ? ?Family History:  The patient's family history includes Alcohol abuse in his father; Arthritis in his mother; Benign prostatic hyperplasia in his father; Diabetes in his sister; Early death in his paternal grandfather and paternal grandmother; Heart attack in his maternal grandfather; Heart disease in his maternal grandfather; Hyperlipidemia in his mother; Lymphoma in his maternal grandfather; Other in his maternal grandmother and sister.  ? ? ?ROS:  Please see the history of present illness.   Otherwise, review of systems are positive for none.   All other systems are reviewed and negative.  ? ? ?PHYSICAL EXAM: ?VS:  BP 120/82 (BP Location: Left Arm, Patient Position: Sitting, Cuff Size: Normal)   Pulse (!) 55   Ht 6' (1.829 m)   Wt 220 lb (99.8 kg)   SpO2 98%   BMI 29.84 kg/m?  , BMI Body mass index is 29.84 kg/m?. ?GEN: Well nourished, well developed, in no acute distress ?HEENT: normal ?Neck: no JVD, carotid bruits, or masses ?Cardiac: RRR; no murmurs, rubs, or gallops,no edema  ?Respiratory:  clear to auscultation bilaterally, normal work of breathing ?GI: soft,  nontender, nondistended, + BS ?MS: no deformity or atrophy ?Skin: warm and dry, no rash ?Neuro:  Strength and sensation are intact ?Psych: euthymic mood, full affect ? ? ?EKG:  EKG is ordered today. ?The ekg ordered today demonstrates NSR with first degree AV block. Rate 55. Otherwise normal. I have personally reviewed and interpreted this study. ? ? ? ?Recent Labs: ?08/19/2020: ALT 30; BUN 18; Creatinine, Ser 0.92; Hemoglobin 14.1; Platelets 272.0; Potassium 4.5; Sodium 136  ? ? ?Lipid Panel ?   ?Component Value Date/Time  ? CHOL 309 (H) 08/19/2020 0844  ? TRIG (H) 08/19/2020 0844  ?  498.0 Triglyceride is over 400; calculations on Lipids are invalid.  ? HDL 44.80 08/19/2020 0844  ?  CHOLHDL 7 08/19/2020 0844  ? LDLCALC 198 10/30/2018 0000  ? LDLDIRECT 165.0 08/19/2020 0844  ? ?  ? ?Wt Readings from Last 3 Encounters:  ?06/28/21 220 lb (99.8 kg)  ?06/27/21 221 lb (100.2 kg)  ?06/25/20 219 lb 3.2 oz (99.4 kg)  ?  ? ? ?Other studies Reviewed: ?Additional studies/ records that were reviewed today include: none. ?Review of the above records demonstrates: n/a ? ? ?ASSESSMENT AND PLAN: ? ?1.  Chest pain with intermediate risk for coronary disease. Needs ischemic evaluation. Discussed evaluation options including stress testing versus CTA. I would recommend CTA. Will arrange.  ?2. Hyperlipidemia. Labs a year ago showed significant elevation of trigycerides and cholesterol. Discussed the role of alcohol on his triglycerides and recommend he reduce Etoh intake. Needs to lose weight.  Agree with addition of Crestor. CTA will help inform us of how aggressive to treat his lipids.  ?3. History of Horse shoe kidney.  ? ? ?Current medicines are reviewed at length with the patient today.  The patient does not have concerns regarding medicines. ? ?The following changes have been made:  no change ? ?Labs/ tests ordered today include:  ? ?Orders Placed This Encounter  ?Procedures  ? CT CORONARY MORPH W/CTA COR W/SCORE W/CA W/CM &/OR WO/CM  ? EKG 12-Lead  ? ? ?   ? ? ?Disposition:   FU with me after above study.  ? ?Signed, ?Reyonna Haack [place], MD  ?06/28/2021 4:25 PM    ?[facility] ?[address] [place], [place], [address] ?Phone [phone], Fax [phone] ? ? ?

## 2021-06-28 NOTE — Patient Instructions (Addendum)
Medication Instructions:  ?Continue same medications ? ? ?Lab Work: ?Keep lab appointment with Primary Care Fax results to Dr.Jordan ? ? ?Testing/Procedures: ?Coronary CT will be scheduled after approved by insurance   Follow instructions below ? ? ?Follow-Up: ?At [facility], you and your health needs are our priority.  As part of our continuing mission to provide you with exceptional heart care, we have created designated Provider Care Teams.  These Care Teams include your primary Cardiologist (physician) and Advanced Practice Providers (APPs -  Physician Assistants and Nurse Practitioners) who all work together to provide you with the care you need, when you need it. ? ?We recommend signing up for the patient portal called "MyChart".  Sign up information is provided on this After Visit Summary.  MyChart is used to connect with patients for Virtual Visits (Telemedicine).  Patients are able to view lab/test results, encounter notes, upcoming appointments, etc.  Non-urgent messages can be sent to your provider as well.   ?To learn more about what you can do with MyChart, go to [email or web address].   ? ?Your next appointment:   ?  ? ?The format for your next appointment: Office   ? ? ?Provider:  Dr.Jordan ? ? ? ? ?Your cardiac CT will be scheduled at one of the below locations:  ? ?[facility] ?[address] ?[place], [place] [address] ?[phone] ? ?OR ? ?[facility] ?[address] ?[address] ?[place], [place] [address] ?[phone] ? ?If scheduled at [facility], please arrive at the [facility] Entrance (Entrance C2) of [facility] 30 minutes prior to test start time. ?You can use the FREE valet parking offered at entrance C (encouraged to control the heart rate for the test)  ?Proceed to the [facility] Radiology Department (first floor) to check-in and test prep. ? ?All radiology patients and guests should use entrance C2 at  [facility], accessed from [facility], even though the hospital's physical address listed is [address]. ? ? ? ?If scheduled at [facility], please arrive 15 mins early for check-in and test prep. ? ?Please follow these instructions carefully (unless otherwise directed): ? ?Hold all erectile dysfunction medications at least 3 days (72 hrs) prior to test. ? ?On the Night Before the Test: ?Be sure to Drink plenty of water. ?Do not consume any caffeinated/decaffeinated beverages or chocolate 12 hours prior to your test. ?Do not take any antihistamines 12 hours prior to your test. ? ? ?On the Day of the Test: ?Drink plenty of water until 1 hour prior to the test. ?Do not eat any food 4 hours prior to the test. ?You may take your regular medications prior to the test.  ?Take metoprolol 25 mg two hours prior to test. ? ? ? ?     ?After the Test: ?Drink plenty of water. ?After receiving IV contrast, you may experience a mild flushed feeling. This is normal. ?On occasion, you may experience a mild rash up to 24 hours after the test. This is not dangerous. If this occurs, you can take Benadryl 25 mg and increase your fluid intake. ?If you experience trouble breathing, this can be serious. If it is severe call 911 IMMEDIATELY. If it is mild, please call our office. ?If you take any of these medications: Glipizide/Metformin, Avandament, Glucavance, please do not take 48 hours after completing test unless otherwise instructed. ? ?We will call to schedule your test 2-4  weeks out understanding that some insurance companies will need an authorization prior to the service being performed.  ? ?For non-scheduling related questions, please contact the cardiac imaging nurse navigator should you have any questions/concerns: ?Rockwell Alexandria, Cardiac Imaging Nurse Navigator ?Larey Brick, Cardiac Imaging Nurse Navigator ?[facility] ?Direct Office Dial:  [phone]  ? ?For scheduling needs, including cancellations and rescheduling, please call [place], [phone]. ? ? ?

## 2021-07-04 ENCOUNTER — Other Ambulatory Visit (INDEPENDENT_AMBULATORY_CARE_PROVIDER_SITE_OTHER): Payer: 59

## 2021-07-04 DIAGNOSIS — E785 Hyperlipidemia, unspecified: Secondary | ICD-10-CM

## 2021-07-04 DIAGNOSIS — Z125 Encounter for screening for malignant neoplasm of prostate: Secondary | ICD-10-CM | POA: Diagnosis not present

## 2021-07-04 LAB — COMPREHENSIVE METABOLIC PANEL
ALT: 34 U/L (ref 0–53)
AST: 31 U/L (ref 0–37)
Albumin: 4.3 g/dL (ref 3.5–5.2)
Alkaline Phosphatase: 64 U/L (ref 39–117)
BUN: 15 mg/dL (ref 6–23)
CO2: 29 mEq/L (ref 19–32)
Calcium: 9.5 mg/dL (ref 8.4–10.5)
Chloride: 101 mEq/L (ref 96–112)
Creatinine, Ser: 0.92 mg/dL (ref 0.40–1.50)
GFR: 91.85 mL/min (ref >60.00)
Glucose, Bld: 100 mg/dL — ABNORMAL HIGH (ref 70–99)
Potassium: 4.8 mEq/L (ref 3.5–5.1)
Sodium: 139 mEq/L (ref 135–145)
Total Bilirubin: 0.4 mg/dL (ref 0.2–1.2)
Total Protein: 7.3 g/dL (ref 6.0–8.3)

## 2021-07-04 LAB — LDL CHOLESTEROL, DIRECT: Direct LDL: 133 mg/dL

## 2021-07-04 LAB — CBC WITH DIFFERENTIAL/PLATELET
Basophils Absolute: 0.1 10*3/uL (ref 0.0–0.1)
Basophils Relative: 0.8 % (ref 0.0–3.0)
Eosinophils Absolute: 0.1 10*3/uL (ref 0.0–0.7)
Eosinophils Relative: 1.3 % (ref 0.0–5.0)
HCT: 41.8 % (ref 39.0–52.0)
Hemoglobin: 14.2 g/dL (ref 13.0–17.0)
Lymphocytes Relative: 29.2 % (ref 12.0–46.0)
Lymphs Abs: 1.8 10*3/uL (ref 0.7–4.0)
MCHC: 34.1 g/dL (ref 30.0–36.0)
MCV: 95.4 fl (ref 78.0–100.0)
Monocytes Absolute: 0.7 10*3/uL (ref 0.1–1.0)
Monocytes Relative: 11 % (ref 3.0–12.0)
Neutro Abs: 3.6 10*3/uL (ref 1.4–7.7)
Neutrophils Relative %: 57.7 % (ref 43.0–77.0)
Platelets: 282 10*3/uL (ref 150.0–400.0)
RBC: 4.38 Mil/uL (ref 4.22–5.81)
RDW: 12.9 % (ref 11.5–15.5)
WBC: 6.3 10*3/uL (ref 4.0–10.5)

## 2021-07-04 LAB — LIPID PANEL
Cholesterol: 233 mg/dL — ABNORMAL HIGH (ref 0–200)
HDL: 42.3 mg/dL (ref >39.00)
NonHDL: 190.88
Total CHOL/HDL Ratio: 6
Triglycerides: 269 mg/dL — ABNORMAL HIGH (ref 0.0–149.0)
VLDL: 53.8 mg/dL — ABNORMAL HIGH (ref 0.0–40.0)

## 2021-07-04 LAB — PSA: PSA: 1.05 ng/mL (ref 0.10–4.00)

## 2021-07-06 ENCOUNTER — Encounter: Payer: Self-pay | Admitting: Cardiology

## 2021-07-06 ENCOUNTER — Telehealth: Payer: Self-pay | Admitting: Cardiology

## 2021-07-06 NOTE — Telephone Encounter (Signed)
Called patient; left message to call back.

## 2021-07-06 NOTE — Telephone Encounter (Signed)
?  Pt is requesting to speak with Elnita Maxwell, he said he was told by her to call after his blood work with his pcp ?

## 2021-07-11 ENCOUNTER — Telehealth (HOSPITAL_COMMUNITY): Payer: Self-pay | Admitting: *Deleted

## 2021-07-11 NOTE — Telephone Encounter (Signed)
Attempted to call patient regarding upcoming cardiac CT appointment. °Left message on voicemail with name and callback number ° °Glorimar Stroope RN Navigator Cardiac Imaging °[facility] °[phone] Office °[phone] Cell ° °

## 2021-07-12 ENCOUNTER — Telehealth (HOSPITAL_COMMUNITY): Payer: Self-pay | Admitting: *Deleted

## 2021-07-12 NOTE — Telephone Encounter (Signed)
Reaching out to patient to offer assistance regarding upcoming cardiac imaging study; pt verbalizes understanding of appt date/time, parking situation and where to check in, pre-test NPO status, and verified current allergies; name and call back number provided for further questions should they arise ? ?Larey Brick RN Navigator Cardiac Imaging ?[facility] ?[phone] office ?[phone] cell ? ?Patient states his HR at rest is in the 50's. Advised to not take any metoprolol for the test.  He is aware to arrive at 8am for his 8:30am scan. ?

## 2021-07-13 ENCOUNTER — Other Ambulatory Visit: Payer: Self-pay

## 2021-07-13 ENCOUNTER — Ambulatory Visit (HOSPITAL_COMMUNITY)
Admission: RE | Admit: 2021-07-13 | Discharge: 2021-07-13 | Disposition: A | Discharge status: Home / Self Care | Payer: 59 | Source: Ambulatory Visit | Attending: Cardiology | Admitting: Cardiology

## 2021-07-13 DIAGNOSIS — R079 Chest pain, unspecified: Secondary | ICD-10-CM | POA: Insufficient documentation

## 2021-07-13 DIAGNOSIS — E782 Mixed hyperlipidemia: Secondary | ICD-10-CM | POA: Insufficient documentation

## 2021-07-13 DIAGNOSIS — R072 Precordial pain: Secondary | ICD-10-CM | POA: Insufficient documentation

## 2021-07-13 MED ORDER — IOHEXOL 350 MG/ML SOLN
95.0000 mL | Freq: Once | INTRAVENOUS | Status: AC | PRN
  Administered 2021-07-13: 95 mL via INTRAVENOUS

## 2021-07-13 MED ORDER — NITROGLYCERIN 0.4 MG SL SUBL
0.8000 mg | SUBLINGUAL_TABLET | Freq: Once | SUBLINGUAL | Status: AC
  Administered 2021-07-13: 0.8 mg via SUBLINGUAL

## 2021-07-13 MED ORDER — NITROGLYCERIN 0.4 MG SL SUBL
SUBLINGUAL_TABLET | SUBLINGUAL | Status: AC
  Filled 2021-07-13: qty 2

## 2021-07-13 NOTE — Progress Notes (Signed)
?  ?Cardiology Office Note ? ? ?Date:  07/21/2021  ? ?ID:  Bernard Gregory, DOB [birth year], MRN [id number] ? ?PCP:  Shelva Majestic, MD  ?Cardiologist:   Vineet Kinney [place], MD  ? ?No chief complaint on file. ? ? ?  ?History of Present Illness: ?Bernard Gregory is a 61 y.o. male who is seen at the request of Dr Durene Cal for evaluation of chest pain. He has a history of congenital horse shoe kidney and HLD. He reports 3 weeks ago he developed symptoms of chest pain anteriorly. This was at rest. Associated with lightheadedness but no SOB or diaphoresis. This lasted 1-2 hours. Since then he notes symptoms are less prevalent but have recurred. Once while working in his yard, once exercising and once playing pickleball. Symptoms improved with rest on those occasions. Once at work he got so lightheaded he felt he might pass out. Patient does stay active- uses elliptical at gym and plays tennis. He does report chest pain 6 years ago while running a 5K but states he was out of shape then. No prior cardiac evaluation.  ? ?To further evaluation we performed a coronary CTA. This showed a calcium score of 178 with mild nonobstructive CAD.  ? ?On follow up he denies any recurrent pain. Was started on Crestor based on CTA.  ? ? ? ?Past Medical History:  ?Diagnosis Date  ? Allergy   ? Chronic kidney disease   ? 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 Hyperlipidemia   ? ? ?Past Surgical History:  ?Procedure Laterality Date  ? FINGER SURGERY    ? hedge trimmer accident- right 2nd finger  ? HERNIA REPAIR    ? ? ? ?Current Outpatient Medications  ?Medication Sig Dispense Refill  ? aspirin EC 81 MG tablet Take 81 mg by mouth daily. Swallow whole.    ? metoprolol tartrate (LOPRESSOR) 25 MG tablet Take 25 mg 2 hours before Coronary CT 1 tablet 0  ? Multiple Vitamin (MULTI-VITAMIN DAILY PO) Take by mouth.    ? nitroGLYCERIN (NITROSTAT) 0.4 MG SL tablet Place 1 tablet (0.4 mg total) under the tongue every 5 (five) minutes as needed for chest pain (max twice then  call 911). 50 tablet 3  ? rosuvastatin (CRESTOR) 20 MG tablet Take 1 tablet (20 mg total) by mouth daily. 90 tablet 3  ? ?No current facility-administered medications for this visit.  ? ? ?Allergies:   Bee pollen and Cherry  ? ? ?Social History:  The patient  reports that he has never smoked. He has never used smokeless tobacco. He reports current alcohol use of about 18.0 standard drinks per week. He reports that he does not use drugs.  ? ?Family History:  The patient's family history includes Alcohol abuse in his father; Arthritis in his mother; Benign prostatic hyperplasia in his father; Diabetes in his sister; Early death in his paternal grandfather and paternal grandmother; Heart attack in his maternal grandfather; Heart disease in his maternal grandfather; Hyperlipidemia in his mother; Lymphoma in his maternal grandfather; Other in his maternal grandmother and sister.  ? ? ?ROS:  Please see the history of present illness.   Otherwise, review of systems are positive for none.   All other systems are reviewed and negative.  ? ? ?PHYSICAL EXAM: ?VS:  BP 110/80 (BP Location: Left Arm)   Pulse 61   Ht 6' (1.829 m)   Wt 221 lb (100.2 kg)   SpO2 97%   BMI 29.97 kg/m?  , BMI Body  mass index is 29.97 kg/m?. ?GEN: Well nourished, well developed, in no acute distress ?HEENT: normal ?Neck: no JVD, carotid bruits, or masses ?Cardiac: RRR; no murmurs, rubs, or gallops,no edema  ?Respiratory:  clear to auscultation bilaterally, normal work of breathing ?GI: soft, nontender, nondistended, + BS ?MS: no deformity or atrophy ?Skin: warm and dry, no rash ?Neuro:  Strength and sensation are intact ?Psych: euthymic mood, full affect ? ? ?EKG:  EKG is not ordered today. ? ? ? ? ?Recent Labs: ?07/04/2021: ALT 34; BUN 15; Creatinine, Ser 0.92; Hemoglobin 14.2; Platelets 282.0; Potassium 4.8; Sodium 139  ? ? ?Lipid Panel ?   ?Component Value Date/Time  ? CHOL 233 (H) 07/04/2021 63010838  ? TRIG 269.0 (H) 07/04/2021 [id number]  ? HDL 42.30  07/04/2021 0838  ? CHOLHDL 6 07/04/2021 0838  ? VLDL 53.8 (H) 07/04/2021 [id number]  ? LDLCALC 198 10/30/2018 0000  ? LDLDIRECT 133.0 07/04/2021 0838  ? ?  ? ?Wt Readings from Last 3 Encounters:  ?07/21/21 221 lb (100.2 kg)  ?06/28/21 220 lb (99.8 kg)  ?06/27/21 221 lb (100.2 kg)  ?  ? ? ?Other studies Reviewed: ?Additional studies/ records that were reviewed today include:  ? ?ADDENDUM REPORT: 07/13/2021 09:58 ?  ?CLINICAL DATA:  61 yo male with chest pain ?  ?EXAM: ?Cardiac/Coronary CTA ?  ?TECHNIQUE: ?A non-contrast, gated CT scan was obtained with axial slices of 3 mm ?through the heart for calcium scoring. Calcium scoring was performed ?using the Agatston method. A 120 kV prospective, gated, contrast ?cardiac scan was obtained. Gantry rotation speed was 250 msecs and ?collimation was 0.6 mm. Two sublingual nitroglycerin tablets (0.8 ?mg) were given. The 3D data set was reconstructed in 5% intervals of ?the 35-75% of the R-R cycle. Diastolic phases were analyzed on a ?dedicated workstation using MPR, MIP, and VRT modes. The patient ?received 95 cc of contrast. ?  ?FINDINGS: ?Image quality: Good ?  ?Noise artifact is: Limited. ?  ?Coronary Arteries:  Normal coronary origin.  Right dominance. ?  ?Left main: The left main is a large caliber vessel with a normal ?take off from the left coronary cusp that bifurcates trifurcates ?into a LAD, LCX, and ramus intermedius. There is minimal (0-24) ?calcified plaque in the distal vessel. ?  ?Left anterior descending artery: The LAD has minimal (0-24) ?calcified plaque in the proximal vessel. The LAD gives off 2 patent ?diagonal branches. ?  ?Ramus intermedius: Patent with no evidence of plaque or stenosis. ?  ?Left circumflex artery: The LCX is non-dominant with minimal (0-24) ?calcified plaque in the proximal vessel. The LCX gives off 2 small ?obtuse marginal branches. ?  ?Right coronary artery: The RCA is dominant with normal take off from ?the right coronary cusp. There is  minimal (0-24) calcified plaque in ?the proximal vessel. The RCA terminates as a PDA and right ?posterolateral branch without evidence of plaque or stenosis. ?  ?Right Atrium: Right atrial size is within normal limits. ?  ?Right Ventricle: The right ventricular cavity is within normal ?limits. ?  ?Left Atrium: Left atrial size is normal in size with no left atrial ?appendage filling defect. ?  ?Left Ventricle: The ventricular cavity size is within normal limits. ?There are no stigmata of prior infarction. There is no abnormal ?filling defect. ?  ?Pulmonary arteries: Normal in size without proximal filling defect. ?  ?Pulmonary veins: Normal pulmonary venous drainage. ?  ?Pericardium: Normal thickness with no significant effusion or ?calcium present. ?  ?Cardiac valves: The aortic valve  is trileaflet without significant ?calcification. The mitral valve is normal structure without ?significant calcification. ?  ?Aorta: Normal caliber with no significant disease. ?  ?Extra-cardiac findings: See attached radiology report for ?non-cardiac structures. ?  ?IMPRESSION: ?1. Coronary calcium score of 178. This was 61 percentile for age-, ?sex, and race-matched controls. ?  ?2. Normal coronary origin with right dominance. ?  ?3. Minimal calcified plaque in the distal LM, proximal LAD, proximal ?Lcx and proximal RCA. ?  ?RECOMMENDATIONS: ?CAD-RADS 1: Minimal non-obstructive CAD (0-24%). Consider ?non-atherosclerotic causes of chest pain. Consider preventive ?therapy and risk factor modification. ?  ?Olga Millers, MD ?  ?  ?Electronically Signed ?  By: Olga Millers M.D. ?  On: 07/13/2021 09:58 ?   ?  ? ? ? ?ASSESSMENT AND PLAN: ? ?1.  CAD. CTA shows nonobstructive CAD. Need to focus on risk factor modification. I don't see anything that would explain the chest pain he had but it has resolved.  ? ?2. Hyperlipidemia. Goal LDL is now < 70 given CAD. On Crestor. Will repeat lab in 3 months. Discussed heart healthy diet and  regular aerobic exercise.  ? ?3. History of Horseshoe kidney.  ? ? ?Current medicines are reviewed at length with the patient today.  The patient does not have concerns regarding medicines. ? ?The following changes ha

## 2021-07-18 ENCOUNTER — Ambulatory Visit: Payer: 59 | Admitting: Cardiology

## 2021-07-21 ENCOUNTER — Ambulatory Visit (INDEPENDENT_AMBULATORY_CARE_PROVIDER_SITE_OTHER): Payer: 59 | Admitting: Cardiology

## 2021-07-21 ENCOUNTER — Encounter: Payer: Self-pay | Admitting: Cardiology

## 2021-07-21 VITALS — BP 110/80 | HR 61 | Ht 72.0 in | Wt 221.0 lb

## 2021-07-21 DIAGNOSIS — I2510 Atherosclerotic heart disease of native coronary artery without angina pectoris: Secondary | ICD-10-CM | POA: Diagnosis not present

## 2021-07-21 DIAGNOSIS — E782 Mixed hyperlipidemia: Secondary | ICD-10-CM

## 2021-08-10 ENCOUNTER — Ambulatory Visit: Payer: 59 | Admitting: Cardiology

## 2021-11-10 ENCOUNTER — Telehealth: Payer: Self-pay | Admitting: Family Medicine

## 2021-11-10 DIAGNOSIS — L989 Disorder of the skin and subcutaneous tissue, unspecified: Secondary | ICD-10-CM

## 2021-11-10 NOTE — Telephone Encounter (Signed)
This should be to derm and not podiatry considering the "black dots"?

## 2021-11-10 NOTE — Telephone Encounter (Signed)
Patient requests a Referral to a Foot Doctor. Patient states he has several black dots on the outside part near the arch on right foot. Patient states he did not want to schedule appointment at [facility] at this time unless Dr. Durene Cal feels he should. Patient requests to be called at ph# [phone] for status of the above request.

## 2021-11-10 NOTE — Telephone Encounter (Signed)
May refer to podiatry under foot lesion per his request-I am certainly open to seeing him if he prefers as a first step but looks like he prefers starting with podiatry-with private insurance he may be able to self refer as well

## 2021-11-11 NOTE — Telephone Encounter (Signed)
Referral has been placed. 

## 2021-11-23 ENCOUNTER — Ambulatory Visit (INDEPENDENT_AMBULATORY_CARE_PROVIDER_SITE_OTHER): Payer: 59 | Admitting: Podiatry

## 2021-11-23 DIAGNOSIS — I83891 Varicose veins of right lower extremities with other complications: Secondary | ICD-10-CM

## 2021-11-23 NOTE — Progress Notes (Signed)
   Chief Complaint  Patient presents with   Foot Problem    R foot on insole, on the inside part of the side of the foot that has been there for 2 months that will not go away, painful only when he mashes it.    HPI: 61 y.o. male presenting today as a new patient for evaluation of symptomatic lesions to the medial aspect of the right foot that have been present for about a year.  Gradual onset.  Minimal tenderness.  He does have some associated tenderness with certain shoes that he wears.  He says that they are not normally painful except for with direct pressure.  He is not on anything currently for treatment.  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    Cherry Swelling    Tongue swelling       Physical Exam: General: The patient is alert and oriented x3 in no acute distress.  Dermatology: Skin is warm, dry and supple bilateral lower extremities. Negative for open lesions or macerations.  Vascular: Palpable pedal pulses bilaterally. Capillary refill within normal limits.  Negative for any significant edema or erythema.  It does appear that there is a small cluster of superficial varicosities to the medial aspect of the right foot.  Please see above noted photo.  No active bleeding.  There is some associated tenderness with palpation.  Neurological: Light touch and protective threshold grossly intact  Musculoskeletal Exam: No pedal deformities noted   Assessment: 1.  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 Patient evaluated. X-Rays reviewed.  2.  Light debridement of some of the overlying callus over  one of the varicose veins was performed using a tissue nipper without incident or bleeding.  There was some relief after debridement. 3.  For now we are going to simply observe.  The patient does have a follow-up with his PCP in about 6 months.  Recommend that he discuss his these lesions to his PCP for possible vascular referral if they continue to be symptomatic or change in size 4.  Return to clinic as needed      Felecia Shelling, DPM [facility]  Dr. Felecia Shelling, DPM    [address] [place], [place] [address]                Office [phone]  Fax [phone]

## 2021-12-29 ENCOUNTER — Other Ambulatory Visit: Payer: Self-pay

## 2021-12-29 DIAGNOSIS — Z1283 Encounter for screening for malignant neoplasm of skin: Secondary | ICD-10-CM

## 2022-01-03 ENCOUNTER — Other Ambulatory Visit: Payer: Self-pay

## 2022-01-03 DIAGNOSIS — Z1283 Encounter for screening for malignant neoplasm of skin: Secondary | ICD-10-CM

## 2022-01-09 ENCOUNTER — Encounter: Payer: Self-pay | Admitting: *Deleted

## 2022-02-08 ENCOUNTER — Ambulatory Visit: Payer: 59 | Admitting: Physician Assistant

## 2022-03-30 ENCOUNTER — Encounter: Payer: Self-pay | Admitting: *Deleted

## 2022-05-27 LAB — BASIC METABOLIC PANEL
BUN/Creatinine Ratio: 16 (ref 9–20)
BUN: 15 mg/dL (ref 6–24)
CO2: 25 mmol/L (ref 20–29)
Calcium: 10 mg/dL (ref 8.7–10.2)
Chloride: 100 mmol/L (ref 96–106)
Creatinine, Ser: 0.91 mg/dL (ref 0.76–1.27)
Glucose: 92 mg/dL (ref 70–99)
Potassium: 5.1 mmol/L (ref 3.5–5.2)
Sodium: 139 mmol/L (ref 134–144)
eGFR: 97 mL/min/{1.73_m2} (ref >59)

## 2022-05-27 LAB — HEPATIC FUNCTION PANEL
ALT: 33 IU/L (ref 0–44)
AST: 30 IU/L (ref 0–40)
Albumin: 4.8 g/dL (ref 3.8–4.9)
Alkaline Phosphatase: 59 IU/L (ref 44–121)
Bilirubin Total: 0.6 mg/dL (ref 0.0–1.2)
Bilirubin, Direct: 0.17 mg/dL (ref 0.00–0.40)
Total Protein: 7.5 g/dL (ref 6.0–8.5)

## 2022-05-27 LAB — LIPID PANEL
Chol/HDL Ratio: 3.5 ratio (ref 0.0–5.0)
Cholesterol, Total: 182 mg/dL (ref 100–199)
HDL: 52 mg/dL (ref >39)
LDL Chol Calc (NIH): 115 mg/dL — ABNORMAL HIGH (ref 0–99)
Triglycerides: 82 mg/dL (ref 0–149)
VLDL Cholesterol Cal: 15 mg/dL (ref 5–40)

## 2022-05-30 ENCOUNTER — Telehealth: Payer: Self-pay | Admitting: *Deleted

## 2022-05-30 DIAGNOSIS — E782 Mixed hyperlipidemia: Secondary | ICD-10-CM

## 2022-05-30 MED ORDER — ROSUVASTATIN CALCIUM 40 MG PO TABS: 40.0000 mg | ORAL_TABLET | Freq: Every day | ORAL | 3 refills | Status: AC

## 2022-05-30 NOTE — Telephone Encounter (Signed)
-----   Message from Peter M [place], MD sent at 05/28/2022  9:00 AM EST ----- The following abnormalities are noted:  chemistries are normal. Triglycerides are much better but LDL is still not at goal < 70.  All other values are normal, stable or within acceptable limits. Medication changes / Follow up labs / Other changes or recommendations:   I would increase Crestor to 40 mg daily and repeat LFTs and lipids in 3 months  Peter [place], MD 05/28/2022 8:59 AM

## 2022-05-30 NOTE — Telephone Encounter (Signed)
Pt has reviewed results via my chart  New script sent to the pharmacy  Lab orders mailed to the pt

## 2022-07-24 ENCOUNTER — Ambulatory Visit (INDEPENDENT_AMBULATORY_CARE_PROVIDER_SITE_OTHER): Payer: 59 | Admitting: Family Medicine

## 2022-07-24 ENCOUNTER — Encounter: Payer: Self-pay | Admitting: Family Medicine

## 2022-07-24 VITALS — BP 122/80 | HR 63 | Temp 97.0°F | Ht 72.0 in | Wt 215.6 lb

## 2022-07-24 DIAGNOSIS — E663 Overweight: Secondary | ICD-10-CM | POA: Diagnosis not present

## 2022-07-24 DIAGNOSIS — Z0000 Encounter for general adult medical examination without abnormal findings: Secondary | ICD-10-CM

## 2022-07-24 DIAGNOSIS — E785 Hyperlipidemia, unspecified: Secondary | ICD-10-CM

## 2022-07-24 DIAGNOSIS — Z125 Encounter for screening for malignant neoplasm of prostate: Secondary | ICD-10-CM | POA: Diagnosis not present

## 2022-07-24 DIAGNOSIS — Z131 Encounter for screening for diabetes mellitus: Secondary | ICD-10-CM

## 2022-07-24 NOTE — Progress Notes (Signed)
Phone: [phone]   Subjective:  Patient presents today for their annual physical. Chief complaint-noted.   See problem oriented charting- ROS- full  review of systems was completed and negative  except for: history of BENIGN PAROXYSMAL POSITIONAL VERTIGO (BPPV)- has had some issues a few months ago but that has resolved, muscle aches more in AM. 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    Hyperlipidemia    Patient Active Problem List   Diagnosis Date Noted   Exertional chest pain 06/27/2021    Priority: High   Hyperlipidemia 06/25/2020    Priority: Medium    Horseshoe kidney 06/25/2020    Priority: Medium    Allergic rhinitis 06/25/2020    Priority: Low   Carpal tunnel syndrome 06/25/2020    Priority: Low   Osteoarthritis, 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 never smoker   Heart attack Maternal Grandfather    Lymphoma Maternal Grandfather    Early death Paternal Grandmother        unclear cause and age   Early death Paternal Grandfather        unclear cause and age    Medications- reviewed and updated Current Outpatient Medications  Medication Sig Dispense Refill   aspirin EC 81 MG tablet Take 81 mg by mouth daily. Swallow whole.     metoprolol tartrate  (LOPRESSOR) 25 MG tablet Take 25 mg 2 hours before Coronary CT 1 tablet 0   Multiple Vitamin (MULTI-VITAMIN DAILY PO) Take by mouth.     nitroGLYCERIN (NITROSTAT) 0.4 MG SL tablet Place 1 tablet (0.4 mg total) under the tongue every 5 (five) minutes as needed for chest pain (max twice then call 911). 50 tablet 3   rosuvastatin (CRESTOR) 40 MG tablet Take 1 tablet (40 mg total) by mouth daily. 90 tablet 3   No current facility-administered medications for this visit.    Allergies-reviewed and updated Allergies  Allergen Reactions   Bee Pollen Itching    Eyes itch and water.    Cherry Swelling    Tongue swelling     Social History   Social History Narrative   Married. 55 year old son at [employer] senior and 84 year old freshman at [employer] 06/2021      Engineer environmental- [employer]       Hobbies: shows, music, movies, golf, tennis, yardwork   Objective  Objective:  BP 122/80   Pulse 63   Temp (!) 97 F (36.1 C)   Ht 6' (1.829 m)   Wt 215 lb 9.6  oz (97.8 kg)   SpO2 96%   BMI 29.24 kg/m  Gen: NAD, resting comfortably HEENT: Mucous membranes are moist. Oropharynx normal Neck: no thyromegaly CV: RRR no murmurs rubs or gallops Lungs: CTAB no crackles, wheeze, rhonchi Abdomen: soft/nontender/nondistended/normal bowel sounds. No rebound or guarding.  Ext: no edema Skin: warm, dry Neuro: grossly normal, moves all extremities, PERRLA Rotator cuff testing normal   Assessment and Plan  62 y.o. male presenting for annual physical.  Health Maintenance counseling: 1. Anticipatory guidance: Patient counseled regarding regular dental exams -q6 months, eye exams -yearly,  avoiding smoking and second hand smoke, limiting alcohol to 2 beverages per day - about 8 a week, no illicit drugs .   2. Risk factor reduction:  Advised patient of need for regular exercise and diet rich and fruits and vegetables to reduce risk of heart attack and stroke.  Exercise- playing tennis some or other  exercise.  Diet/weight management-Down 6 pounds in the last year- trying to improve quantity and reduce carbs.  Wt Readings from Last 3 Encounters:  07/24/22 215 lb 9.6 oz (97.8 kg)  07/21/21 221 lb (100.2 kg)  06/28/21 220 lb (99.8 kg)  3. Immunizations/screenings/ancillary studies- may have had 2023 COVID shot- he is going to look into this- also had COVID fall 2023- consider again fall 2024 Immunization History  Administered Date(s) Administered   Influenza,inj,Quad PF,6+ Mos 03/31/2021   Influenza-Unspecified 01/16/2020   PFIZER(Purple Top)SARS-COV-2 Vaccination 07/13/2019, 08/09/2019, 03/07/2020   Pfizer Covid-19 Vaccine Bivalent Booster 50yrs & up 03/31/2021   Tdap 12/18/2014   Zoster Recombinat (Shingrix) 08/19/2020, 11/09/2020  4. Prostate cancer screening- will trend PSA with labs Lab Results  Component Value Date   PSA 1.05 07/04/2021   PSA 1.03 08/19/2020   PSA 1.03 10/30/2018   5. Colon cancer screening -  had in 2016-but we have been unable to get records so far- [facility] - we will get records but he will also call to confirm date 6. Skin cancer screening-  saw dermatology this year [facility] in [place]- has follow up in 1 year.  advised regular sunscreen use. Denies worrisome, changing, or new skin lesions.  7. Smoking associated screening (lung cancer screening, AAA screen 65-75, UA)- never smoker 8. STD screening - only active with wife  Status of chronic or acute concerns   # Nonobstructive coronary artery disease-CTA showed nonobstructive CAD with calcium score 172 which was 81st percentile for age March 2023 #hyperlipidemia S: Medication: Rosuvastatin 20 mg--> 40 mg (pretty consistent), aspirin 81 mg  -no chest pain or shortness of breath  Lab Results  Component Value Date   CHOL 182 05/26/2022   HDL 52 05/26/2022   LDLCALC 115 (H) 05/26/2022   LDLDIRECT 133.0 07/04/2021   TRIG 82 05/26/2022   CHOLHDL 3.5 05/26/2022   A/P: coronary  artery disease asymptomatic - continue current medications  Update lipids and hoping LDL approaching 70- if still above this could add zetia but prefer to hold steady if possible.   # screen diabetes with overweight, high risk medication(s) statin  #shoulder pain on right- worse in am- try rehab and refer to sports medicine if needed  Recommended follow up: No follow-ups on file. Future Appointments  Date Time Provider Department Center  09/05/2022  3:20 PM [place], Peter M, MD [facility] None    Lab/Order associations: NOT fasting No diagnosis found.  No orders of the defined types were placed in this encounter.   Return precautions advised.  Garret Reddish, MD

## 2022-07-24 NOTE — Patient Instructions (Addendum)
Sign release of information at the check out desk for Colonoscopy from [facility] -also can directly call [facility] and ask them when you are due nexxt  Team please give him exercises for rotator cuff I want you to do the exercise 3x a week for a month then once a week for another month. Stop any exercise that causes more than 1-2/10 pain increase. If not doing better within 1-2 months let us refer you to sports medicine  Please stop by lab before you go If you have mychart- we will send your results within 3 business days of [place] receiving them.  If you do not have mychart- we will call you about results within 5 business days of [place] receiving them.  *please also note that you will see labs on mychart as soon as they post. I will later go in and write notes on them- will say "notes from Dr. Durene Cal"   Recommended follow up: Return in about 1 year (around 07/24/2023) for physical or sooner if needed.Schedule b4 you leave.

## 2022-07-25 LAB — CBC WITH DIFFERENTIAL/PLATELET
Basophils Absolute: 0.1 10*3/uL (ref 0.0–0.1)
Basophils Relative: 0.7 % (ref 0.0–3.0)
Eosinophils Absolute: 0.1 10*3/uL (ref 0.0–0.7)
Eosinophils Relative: 1.2 % (ref 0.0–5.0)
HCT: 41.5 % (ref 39.0–52.0)
Hemoglobin: 14.4 g/dL (ref 13.0–17.0)
Lymphocytes Relative: 25.5 % (ref 12.0–46.0)
Lymphs Abs: 2.2 10*3/uL (ref 0.7–4.0)
MCHC: 34.7 g/dL (ref 30.0–36.0)
MCV: 95.8 fl (ref 78.0–100.0)
Monocytes Absolute: 0.6 10*3/uL (ref 0.1–1.0)
Monocytes Relative: 7.3 % (ref 3.0–12.0)
Neutro Abs: 5.5 10*3/uL (ref 1.4–7.7)
Neutrophils Relative %: 65.3 % (ref 43.0–77.0)
Platelets: 289 10*3/uL (ref 150.0–400.0)
RBC: 4.33 Mil/uL (ref 4.22–5.81)
RDW: 13.6 % (ref 11.5–15.5)
WBC: 8.5 10*3/uL (ref 4.0–10.5)

## 2022-07-25 LAB — PSA: PSA: 1.21 ng/mL (ref 0.10–4.00)

## 2022-07-25 LAB — HEMOGLOBIN A1C: Hgb A1c MFr Bld: 6 % (ref 4.6–6.5)

## 2022-09-05 ENCOUNTER — Ambulatory Visit: Payer: 59 | Admitting: Cardiology

## 2022-10-23 ENCOUNTER — Ambulatory Visit: Payer: 59 | Admitting: Cardiology

## 2023-07-26 ENCOUNTER — Encounter: Payer: 59 | Admitting: Family Medicine

## 2023-09-19 ENCOUNTER — Ambulatory Visit (INDEPENDENT_AMBULATORY_CARE_PROVIDER_SITE_OTHER): Admitting: Podiatry

## 2023-09-19 ENCOUNTER — Encounter: Payer: Self-pay | Admitting: Podiatry

## 2023-09-19 ENCOUNTER — Ambulatory Visit (INDEPENDENT_AMBULATORY_CARE_PROVIDER_SITE_OTHER)

## 2023-09-19 VITALS — Ht 72.0 in | Wt 215.6 lb

## 2023-09-19 DIAGNOSIS — M778 Other enthesopathies, not elsewhere classified: Secondary | ICD-10-CM

## 2023-09-19 DIAGNOSIS — M722 Plantar fascial fibromatosis: Secondary | ICD-10-CM

## 2023-09-19 MED ORDER — BETAMETHASONE SOD PHOS & ACET 6 (3-3) MG/ML IJ SUSP
3.0000 mg | Freq: Once | INTRAMUSCULAR | Status: AC
  Administered 2023-09-19: 3 mg via INTRA_ARTICULAR

## 2023-09-19 MED ORDER — METHYLPREDNISOLONE 4 MG PO TBPK
ORAL_TABLET | ORAL | 0 refills | Status: AC
Start: 2023-09-19 — End: ?

## 2023-09-19 MED ORDER — MELOXICAM 15 MG PO TABS: 15.0000 mg | ORAL_TABLET | Freq: Every day | ORAL | 1 refills | Status: AC

## 2023-09-19 NOTE — Progress Notes (Signed)
   Chief Complaint  Patient presents with   Foot Pain    Pt is here due to left foot pain states it started about 3-4 months ago no injury to foot, pain is at the arch and heel of the foot, pt bought some inserts that helped a little but the pain is still there.    Subjective: 63 y.o. male presenting today as a new patient for evaluation of left heel pain.  Onset about 4 months ago.  Possibly secondary to stretching exercises while in physical therapy.  He wears good supportive tennis shoes with insoles.   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  Dermatology: Skin is warm, dry and supple bilateral lower extremities. Negative for open lesions or macerations bilateral.   Vascular: Dorsalis Pedis and Posterior Tibial pulses palpable bilateral.  Capillary fill time is immediate to all digits.  Neurological: Grossly intact via light touch  Musculoskeletal: Tenderness to palpation to the plantar aspect of the left heel along the plantar fascia. All other joints range of motion within normal limits bilateral. Strength 5/5 in all groups bilateral.   Radiographic exam LT foot 09/19/2023: Normal osseous mineralization. Joint spaces preserved. No fracture/dislocation/boney destruction. No other soft tissue abnormalities or radiopaque foreign bodies.   Assessment: 1. Plantar fasciitis left foot  Plan of Care:  -Patient evaluated. Xrays reviewed.   -Injection of 0.5cc Celestone soluspan injected into the left plantar fascia.  -Rx for Medrol Dose Pak placed -Prescription for meloxicam 15 mg daily after completion of the Dosepak -Continue ASICS running shoes with power step insoles -Advised against going barefoot.  Recommend recovery slides available on [employer] -Recommend daily stretching exercises which were  demonstrated today -Return to clinic 4 weeks   Dot Gazella, DPM [facility]  Dr. Dot Gazella, DPM    [address] [place], [place] [address]                Office [phone]  Fax [phone]

## 2023-09-19 NOTE — Patient Instructions (Signed)
 Recovery slides available on [employer] (also available at [employer]) -Hokas -New balance -Crocs

## 2023-09-19 NOTE — Progress Notes (Deleted)
.  [id number]

## 2023-09-19 NOTE — Progress Notes (Deleted)
 Bernard Gregory

## 2023-10-17 ENCOUNTER — Ambulatory Visit: Admitting: Podiatry

## 2023-11-07 ENCOUNTER — Ambulatory Visit: Admitting: Podiatry

## 2024-01-01 ENCOUNTER — Encounter: Admitting: Family Medicine

## 2024-03-21 ENCOUNTER — Encounter: Admitting: Family Medicine

## 2024-08-07 ENCOUNTER — Encounter: Admitting: Family Medicine
